# Patient Record
Sex: Male | Born: 1990 | Race: Black or African American | Hispanic: No | Marital: Single | Smoking: Current every day smoker
Health system: Southern US, Community
[De-identification: ages and names within clinical notes are randomized; demographics above are authoritative.]

---

## 2016-01-14 ENCOUNTER — Emergency Department
Admission: EM | Admit: 2016-01-14 | Discharge: 2016-01-14 | Disposition: A | Discharge status: Home / Self Care | Payer: No Typology Code available for payment source | Attending: Emergency Medicine | Admitting: Emergency Medicine

## 2016-01-14 ENCOUNTER — Encounter: Payer: Self-pay | Admitting: Emergency Medicine

## 2016-01-14 ENCOUNTER — Emergency Department: Payer: No Typology Code available for payment source

## 2016-01-14 DIAGNOSIS — Y999 Unspecified external cause status: Secondary | ICD-10-CM | POA: Diagnosis not present

## 2016-01-14 DIAGNOSIS — S161XXA Strain of muscle, fascia and tendon at neck level, initial encounter: Secondary | ICD-10-CM | POA: Diagnosis not present

## 2016-01-14 DIAGNOSIS — Y939 Activity, unspecified: Secondary | ICD-10-CM | POA: Diagnosis not present

## 2016-01-14 DIAGNOSIS — F17210 Nicotine dependence, cigarettes, uncomplicated: Secondary | ICD-10-CM | POA: Insufficient documentation

## 2016-01-14 DIAGNOSIS — Y92410 Unspecified street and highway as the place of occurrence of the external cause: Secondary | ICD-10-CM | POA: Diagnosis not present

## 2016-01-14 DIAGNOSIS — M7918 Myalgia, other site: Secondary | ICD-10-CM

## 2016-01-14 DIAGNOSIS — M791 Myalgia: Secondary | ICD-10-CM | POA: Insufficient documentation

## 2016-01-14 DIAGNOSIS — M25511 Pain in right shoulder: Secondary | ICD-10-CM | POA: Diagnosis present

## 2016-01-14 MED ORDER — CYCLOBENZAPRINE HCL 10 MG PO TABS: 10.0000 mg | ORAL_TABLET | Freq: Three times a day (TID) | ORAL | 0 refills | Status: AC | PRN

## 2016-01-14 MED ORDER — IBUPROFEN 800 MG PO TABS: 800.0000 mg | ORAL_TABLET | Freq: Three times a day (TID) | ORAL | 0 refills | Status: AC | PRN

## 2016-01-14 MED ORDER — OXYCODONE-ACETAMINOPHEN 5-325 MG PO TABS
2.0000 | ORAL_TABLET | Freq: Once | ORAL | Status: AC
  Administered 2016-01-14: 2 via ORAL
  Filled 2016-01-14: qty 2
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
